# Patient Record
Sex: Female | Born: 1975 | Hispanic: Yes | Marital: Married | Smoking: Current some day smoker
Health system: Southern US, Community
[De-identification: ages and names within clinical notes are randomized; demographics above are authoritative.]

## PROBLEM LIST (undated history)

## (undated) HISTORY — PX: TUBAL LIGATION: SHX77

## (undated) HISTORY — PX: CHOLECYSTECTOMY: SHX55

---

## 2016-05-16 ENCOUNTER — Telehealth: Payer: Self-pay | Admitting: Gastroenterology

## 2016-05-16 ENCOUNTER — Encounter: Payer: Self-pay | Admitting: *Deleted

## 2016-05-16 ENCOUNTER — Emergency Department
Admission: EM | Admit: 2016-05-16 | Discharge: 2016-05-16 | Disposition: A | Discharge status: Other Acute Inpatient Hospital | Payer: Medicaid Other | Attending: Emergency Medicine | Admitting: Emergency Medicine

## 2016-05-16 ENCOUNTER — Emergency Department: Payer: Medicaid Other

## 2016-05-16 ENCOUNTER — Encounter: Payer: Self-pay | Admitting: Emergency Medicine

## 2016-05-16 ENCOUNTER — Telehealth: Payer: Self-pay | Admitting: Emergency Medicine

## 2016-05-16 ENCOUNTER — Emergency Department
Admission: EM | Admit: 2016-05-16 | Discharge: 2016-05-16 | Disposition: A | Discharge status: Home / Self Care | Payer: Medicaid Other | Source: Home / Self Care

## 2016-05-16 DIAGNOSIS — F17200 Nicotine dependence, unspecified, uncomplicated: Secondary | ICD-10-CM | POA: Insufficient documentation

## 2016-05-16 DIAGNOSIS — R52 Pain, unspecified: Secondary | ICD-10-CM

## 2016-05-16 DIAGNOSIS — K830 Cholangitis: Secondary | ICD-10-CM | POA: Diagnosis not present

## 2016-05-16 DIAGNOSIS — K8309 Other cholangitis: Secondary | ICD-10-CM

## 2016-05-16 DIAGNOSIS — Z5321 Procedure and treatment not carried out due to patient leaving prior to being seen by health care provider: Secondary | ICD-10-CM | POA: Insufficient documentation

## 2016-05-16 DIAGNOSIS — R509 Fever, unspecified: Secondary | ICD-10-CM

## 2016-05-16 DIAGNOSIS — R1011 Right upper quadrant pain: Secondary | ICD-10-CM | POA: Insufficient documentation

## 2016-05-16 LAB — URINALYSIS, COMPLETE (UACMP) WITH MICROSCOPIC
BILIRUBIN URINE: NEGATIVE
Bacteria, UA: NONE SEEN
Bilirubin Urine: NEGATIVE
GLUCOSE, UA: NEGATIVE mg/dL
GLUCOSE, UA: NEGATIVE mg/dL
HGB URINE DIPSTICK: NEGATIVE
Hgb urine dipstick: NEGATIVE
KETONES UR: NEGATIVE mg/dL
Ketones, ur: NEGATIVE mg/dL
LEUKOCYTES UA: NEGATIVE
LEUKOCYTES UA: NEGATIVE
NITRITE: NEGATIVE
Nitrite: NEGATIVE
PH: 7 (ref 5.0–8.0)
PROTEIN: NEGATIVE mg/dL
Protein, ur: 30 mg/dL — AB
SPECIFIC GRAVITY, URINE: 1.027 (ref 1.005–1.030)
Specific Gravity, Urine: 1.006 (ref 1.005–1.030)
pH: 8 (ref 5.0–8.0)

## 2016-05-16 LAB — COMPREHENSIVE METABOLIC PANEL
ALK PHOS: 134 U/L — AB (ref 38–126)
ALT: 293 U/L — AB (ref 14–54)
ALT: 467 U/L — ABNORMAL HIGH (ref 14–54)
ANION GAP: 7 (ref 5–15)
AST: 620 U/L — ABNORMAL HIGH (ref 15–41)
AST: 434 U/L — ABNORMAL HIGH (ref 15–41)
Albumin: 4 g/dL (ref 3.5–5.0)
Albumin: 4.2 g/dL (ref 3.5–5.0)
Alkaline Phosphatase: 119 U/L (ref 38–126)
Anion gap: 7 (ref 5–15)
BILIRUBIN TOTAL: 1.2 mg/dL (ref 0.3–1.2)
BILIRUBIN TOTAL: 2.9 mg/dL — AB (ref 0.3–1.2)
BUN: 13 mg/dL (ref 6–20)
BUN: 9 mg/dL (ref 6–20)
CALCIUM: 9 mg/dL (ref 8.9–10.3)
CHLORIDE: 105 mmol/L (ref 101–111)
CO2: 25 mmol/L (ref 22–32)
CO2: 24 mmol/L (ref 22–32)
CREATININE: 0.6 mg/dL (ref 0.44–1.00)
Calcium: 9 mg/dL (ref 8.9–10.3)
Chloride: 100 mmol/L — ABNORMAL LOW (ref 101–111)
Creatinine, Ser: 0.54 mg/dL (ref 0.44–1.00)
GFR calc non Af Amer: >60 mL/min (ref >60)
Glucose, Bld: 139 mg/dL — ABNORMAL HIGH (ref 65–99)
Glucose, Bld: 118 mg/dL — ABNORMAL HIGH (ref 65–99)
Potassium: 3.6 mmol/L (ref 3.5–5.1)
Potassium: 3.6 mmol/L (ref 3.5–5.1)
SODIUM: 131 mmol/L — AB (ref 135–145)
Sodium: 137 mmol/L (ref 135–145)
TOTAL PROTEIN: 7.4 g/dL (ref 6.5–8.1)
TOTAL PROTEIN: 8 g/dL (ref 6.5–8.1)

## 2016-05-16 LAB — CBC
HCT: 33.8 % — ABNORMAL LOW (ref 35.0–47.0)
HCT: 34.3 % — ABNORMAL LOW (ref 35.0–47.0)
HEMOGLOBIN: 11.4 g/dL — AB (ref 12.0–16.0)
Hemoglobin: 11.6 g/dL — ABNORMAL LOW (ref 12.0–16.0)
MCH: 27.6 pg (ref 26.0–34.0)
MCH: 26.9 pg (ref 26.0–34.0)
MCHC: 34.3 g/dL (ref 32.0–36.0)
MCHC: 33.3 g/dL (ref 32.0–36.0)
MCV: 80.5 fL (ref 80.0–100.0)
MCV: 80.8 fL (ref 80.0–100.0)
PLATELETS: 207 10*3/uL (ref 150–440)
Platelets: 227 10*3/uL (ref 150–440)
RBC: 4.2 MIL/uL (ref 3.80–5.20)
RBC: 4.24 MIL/uL (ref 3.80–5.20)
RDW: 14.3 % (ref 11.5–14.5)
RDW: 14.7 % — ABNORMAL HIGH (ref 11.5–14.5)
WBC: 14.6 10*3/uL — AB (ref 3.6–11.0)
WBC: 18.2 10*3/uL — AB (ref 3.6–11.0)

## 2016-05-16 LAB — LIPASE, BLOOD
LIPASE: 88 U/L — AB (ref 11–51)
Lipase: 19 U/L (ref 11–51)

## 2016-05-16 LAB — PREGNANCY, URINE: Preg Test, Ur: NEGATIVE

## 2016-05-16 MED ORDER — ACETAMINOPHEN 500 MG PO TABS
ORAL_TABLET | ORAL | Status: AC
  Filled 2016-05-16: qty 2

## 2016-05-16 MED ORDER — PIPERACILLIN-TAZOBACTAM 3.375 G IVPB 30 MIN
3.3750 g | Freq: Once | INTRAVENOUS | Status: AC
  Administered 2016-05-16: 3.375 g via INTRAVENOUS
  Filled 2016-05-16: qty 50

## 2016-05-16 MED ORDER — ACETAMINOPHEN 500 MG PO TABS
1000.0000 mg | ORAL_TABLET | Freq: Once | ORAL | Status: AC
  Administered 2016-05-16: 1000 mg via ORAL

## 2016-05-16 MED ORDER — IOPAMIDOL (ISOVUE-300) INJECTION 61%
100.0000 mL | Freq: Once | INTRAVENOUS | Status: AC | PRN
  Administered 2016-05-16: 100 mL via INTRAVENOUS

## 2016-05-16 MED ORDER — IOPAMIDOL (ISOVUE-300) INJECTION 61%
30.0000 mL | Freq: Once | INTRAVENOUS | Status: AC | PRN
  Administered 2016-05-16: 30 mL via ORAL

## 2016-05-16 NOTE — ED Notes (Signed)
Patient transported to US at this time.  Will continue to monitor.   

## 2016-05-16 NOTE — ED Notes (Signed)
Pt noted leaving ED lobby with family 

## 2016-05-16 NOTE — Telephone Encounter (Signed)
I was paged directly by the ER at [facility]. Spoke with ER MD about her.  I explained I am happy to see her if she is transferred to [facility] (through the usual route of calling [facility], admitted by hospitalist).  Alternatively, she could be admitted to [facility], given IV antibiotics and fluid and seen in the AM by GI there.

## 2016-05-16 NOTE — ED Notes (Signed)
Per dr Lenard Lance, u/s at this time only.  Pt had labs done last night in er.

## 2016-05-16 NOTE — ED Notes (Addendum)
Assessment performed with use of hospital spanish interpreter.  Patient complaining of centralized abdominal pain radiating to her left upper quadrant since yesterday.  Patient reports vomiting 10 times yesterday, none today.  Patient reports intermittent abdominal pain for the past 8 years post cholecystectomy.  Patient states pain is usually in her right upper quadrant.

## 2016-05-16 NOTE — ED Triage Notes (Signed)
Pt presents to ED with right upper quad abd pain that radiates around to her back. Pt states she had her gallbladder removed 9 years ago in [place] but states she has continued pain. Pt states she usually takes biomesina compuesta /250mg  and pain improves, but pt reports she has had no relief today.  Vomiting today X8. Denies fever.  Has been evaluated for the same and was told there may be a gallstone stuck in the bowel duct. Pain increases with palpation.

## 2016-05-16 NOTE — ED Triage Notes (Signed)
Pt reports right upper quad abd pain.  Pt was in the er last night but left without being seen.  Pt returns today with n/v and continues to have pain. Pt alert.  Interpreter with pt.

## 2016-05-16 NOTE — ED Provider Notes (Signed)
[facility] Emergency Department Provider Note  Time seen: 5:57 PM  I have reviewed the triage vital signs and the nursing notes.   HISTORY  Chief Complaint Abdominal Pain    HPI Victoria Cherry is a 41 y.o. female status post cholecystectomy who presents to the emergency department for right upper quadrant pain and vomiting. According to the patient since last night she has been expressing right upper quadrant pain and vomiting. Patient is a fever of 100.9 in the emergency department, unknown to the patient prior to arrival. Patient states she had her gallbladder removed 8 years ago in [place]. She states since that time she occasionally gets right upper quadrant pain but states this is the worse, and she has not had a fever in the past with the pain. Patient came to the emergency department last night for evaluation but left before being seen.Patient states 4/10 pain currently located in the right upper quadrant. Denies any cough or chest pain. States nausea and vomiting, denies diarrhea. Denies dysuria. Denies vaginal bleeding or discharge.  No past medical history on file.  There are no active problems to display for this patient.   Past Surgical History:  Procedure Laterality Date  . CESAREAN SECTION    . CHOLECYSTECTOMY    . TUBAL LIGATION      Prior to Admission medications   Not on File    No Known Allergies  No family history on file.  Social History Social History  Substance Use Topics  . Smoking status: Current Some Day Smoker  . Smokeless tobacco: Never Used  . Alcohol use No    Review of Systems Constitutional: Positive for fever in the emergency department. Eyes: Negative for visual changes. ENT: Negative for congestion Cardiovascular: Negative for chest pain. Respiratory: Negative for shortness of breath. Gastrointestinal: Right upper quadrant pain. Positive for nausea and vomiting. Negative for  diarrhea. Genitourinary: Negative for dysuria. Musculoskeletal: Negative for back pain Neurological: Negative for headache All other ROS negative  ____________________________________________   PHYSICAL EXAM:  VITAL SIGNS: ED Triage Vitals  Enc Vitals Group     BP 05/16/16 1637 (!) 109/53     Pulse Rate 05/16/16 1637 (!) 112     Resp 05/16/16 1637 20     Temp 05/16/16 1637 (!) 100.9 F (38.3 C)     Temp Source 05/16/16 1637 Oral     SpO2 05/16/16 1637 100 %     Weight 05/16/16 1638 193 lb (87.5 kg)     Height 05/16/16 1638  (1.651 m)     Head Circumference --      Peak Flow --      Pain Score 05/16/16 1637 10     Pain Loc --      Pain Edu? --      Excl. in GC? --     Constitutional: Alert and oriented. Well appearing and in no distress. Eyes: Normal exam ENT   Head: Normocephalic and atraumatic.   Nose: No congestion/rhinnorhea.   Mouth/Throat: Mucous membranes are moist. Cardiovascular: Normal rate, regular rhythm around 100 bpm. Respiratory: Normal respiratory effort without tachypnea nor retractions. Breath sounds are clear Gastrointestinal: Soft, moderate right upper quadrant tenderness palpation with mild diffuse tenderness. No rebound or guarding. No distention. Musculoskeletal: Nontender with normal range of motion in all extremities. Neurologic:  Normal speech and language. No gross focal neurologic deficits  Skin:  Skin is warm, dry and intact.  Psychiatric: Mood and affect are normal.  RADIOLOGY   CLINICAL DATA: Right upper quadrant abdominal pain  EXAM: US ABDOMEN LIMITED - RIGHT UPPER QUADRANT  COMPARISON: None.  FINDINGS: Gallbladder:  Surgically absent  Common bile duct:  Diameter: 10 mm  Liver:  No focal lesion identified. Within normal limits in parenchymal echogenicity.  IMPRESSION: 1. Status post cholecystectomy. Prominent common bile duct, likely related to post cholecystectomy status. Suggest correlation  with laboratory values. 2. Otherwise negative right upper quadrant ultrasound      ____________________________________________   INITIAL IMPRESSION / ASSESSMENT AND PLAN / ED COURSE  Pertinent labs & imaging results that were available during my care of the patient were reviewed by me and considered in my medical decision making (see chart for details).  Patient presents to the emergency department right upper quadrant pain since last night, vomiting and fever. Patient's labs yesterday showed a leukocytosis with elevated LFTs as well as a mildly elevated lipase. Patient's ultrasound today shows a prominent biliary duct otherwise negative. Given the patient's continued pain and fever we will obtain a CT scan to further evaluate. If the CT is otherwise negative we will discuss with GI medicine given the prominent biliary duct with elevated LFTs and lipase.  Patient's repeat labs today show an elevating white blood cell count along with continued ability elevation and an increasing bilirubin currently 2.9. Given the patient's fever right upper quadrant pain and dilated biliary duct on ultrasound along with a CT scan showing no other focal abnormality besides possible enteritis I am quite concerned for the possibility of cholangitis in this patient. We will start the patient IV Zosyn. Unfortunately our hospitalist does not have GI coverage tonight. Given the patient's fever with increasing white count and increasing bilirubin we will discuss with[facility] for possible transfer.  Discussed with [facility]. He states he is not on call for our hospital. He believes the patient can be admitted locally to [facility] and be seen by our GI covering doctor tomorrow. However given the patient's fever and increasing bilirubin and white count I believe the patient needs to go to a facility that has the ability to perform an ERCP urgently if needed.  I will discuss with [facility] medicine for possible  transfer.  [facility] has accepted the patient pending ER to ER transfer. I discussed the patient with Dr. Noralee Stain of [facility] ER who is accepted the patient ER to ER. Patient's vitals remained stable with a systolic blood pressure around 100 and a heart rate in the 70s.  ____________________________________________   FINAL CLINICAL IMPRESSION(S) / ED DIAGNOSES  Right upper quadrant pain Fever Vomiting Cholangitis   Minna Antis, MD 05/16/16 2021

## 2016-05-16 NOTE — Telephone Encounter (Signed)
Called patient due to lwot to inquire about condition and follow up plans. Spoke to patient via Victoria Cherry interpretter.  Patient continues to have slight pain in abdomen. I explained that I would recommend that she be evaluated by a physician and explained abnormal lab tests including ast alt and wbc.  She does not have a doctor to go to. I told her she could return to the emergency dept to have exam.  She says she may come back later today.

## 2016-05-18 LAB — URINE CULTURE

## 2017-12-15 IMAGING — US US ABDOMEN LIMITED
1 series · 14 of 25 positions shown · non-contrast
Comparison: None.

CLINICAL DATA: Right upper quadrant abdominal pain

EXAM:
US ABDOMEN LIMITED - RIGHT UPPER QUADRANT

[Series 1: us abdomen limited · 0.20mm/px · 14 of 28 slices shown]
[im 1/28]
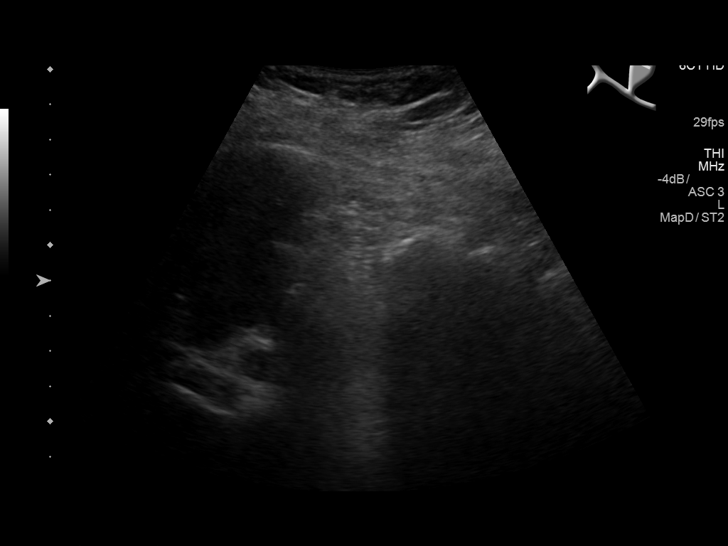
[im 3/28]
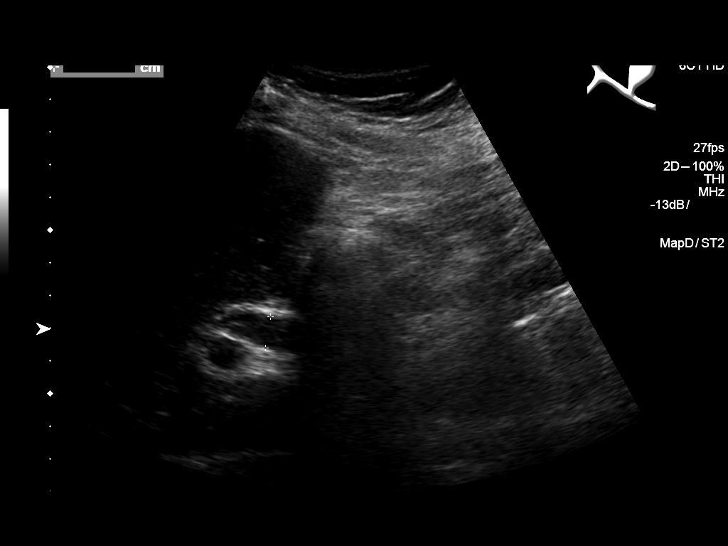
[im 5/28]
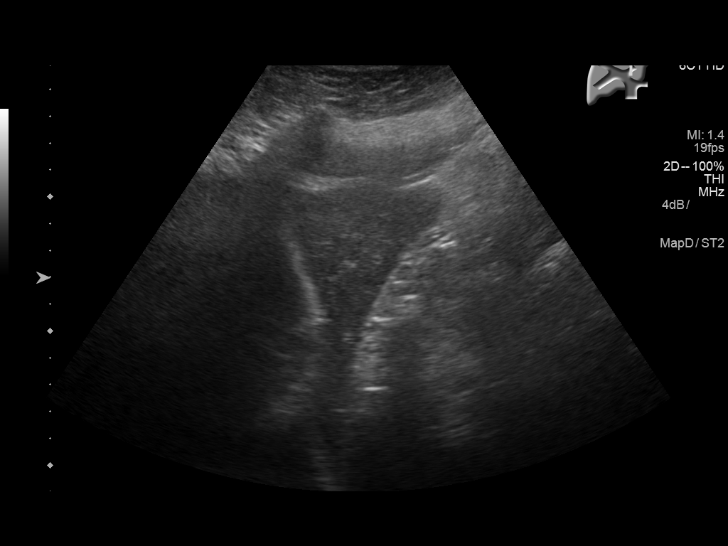
[im 7/28]
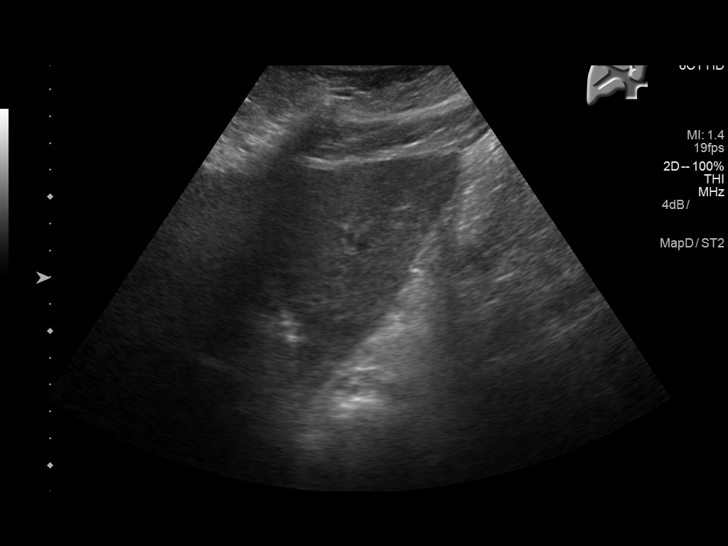
[im 10/28]
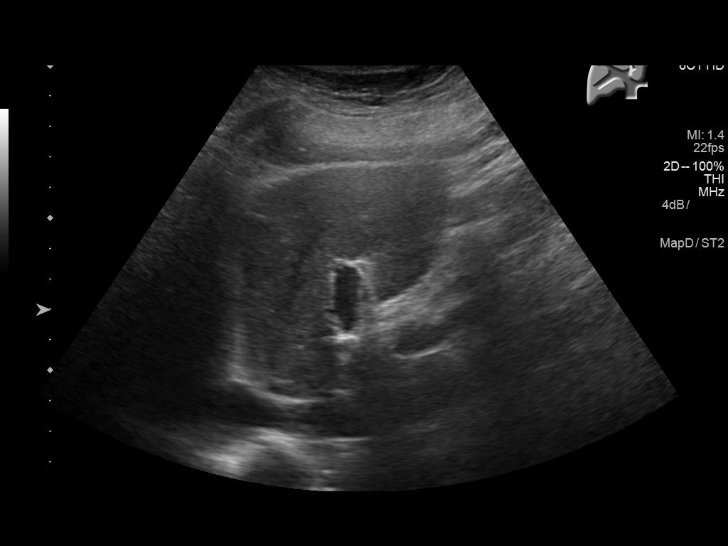
[im 11/28]
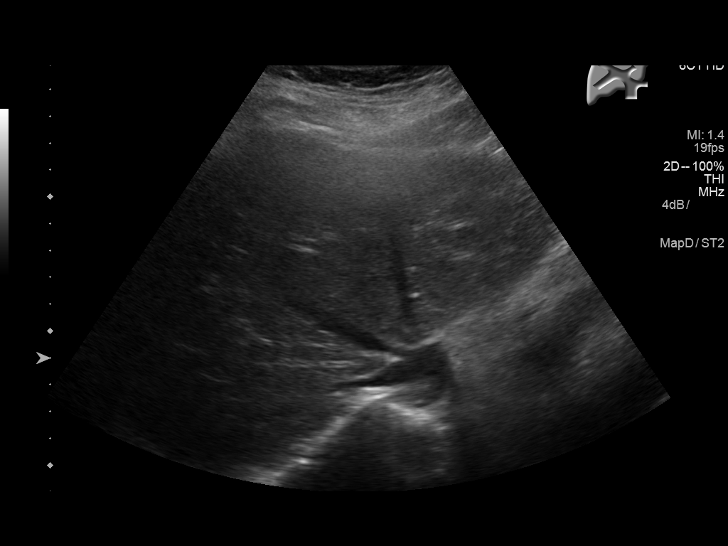
[im 13/28]
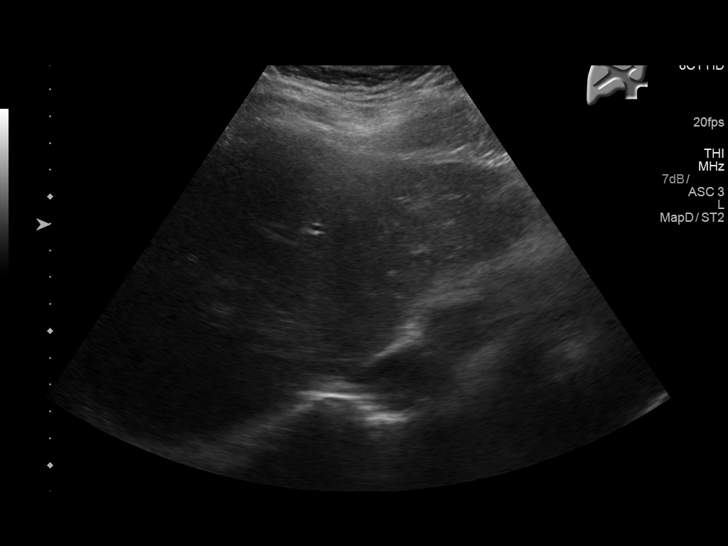
[im 15/28]
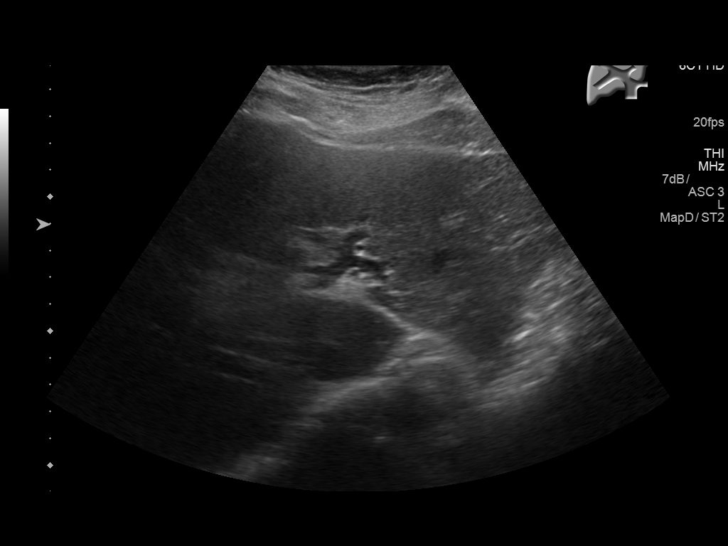
[im 17/28]
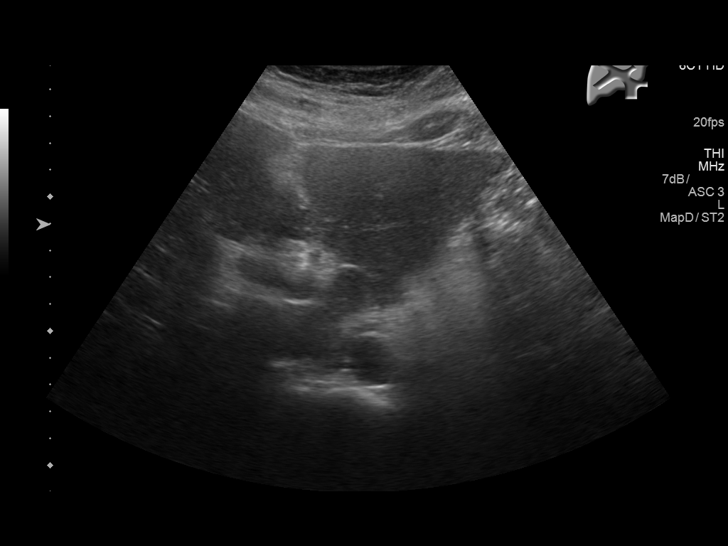
[im 19/28]
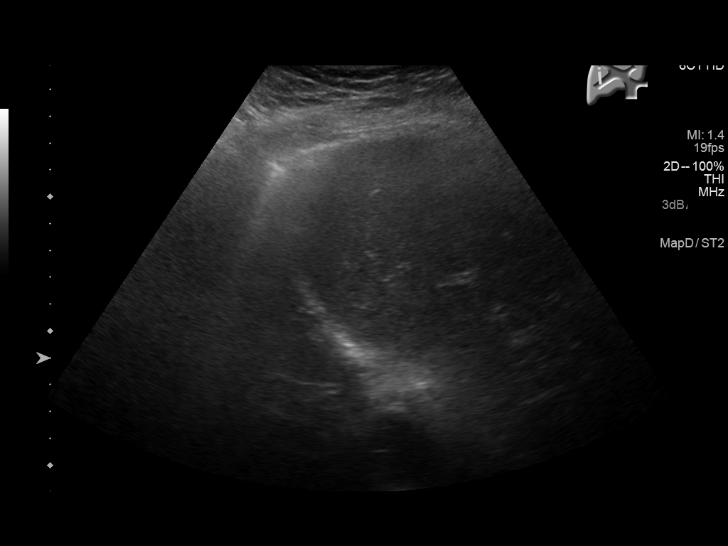
[im 21/28]
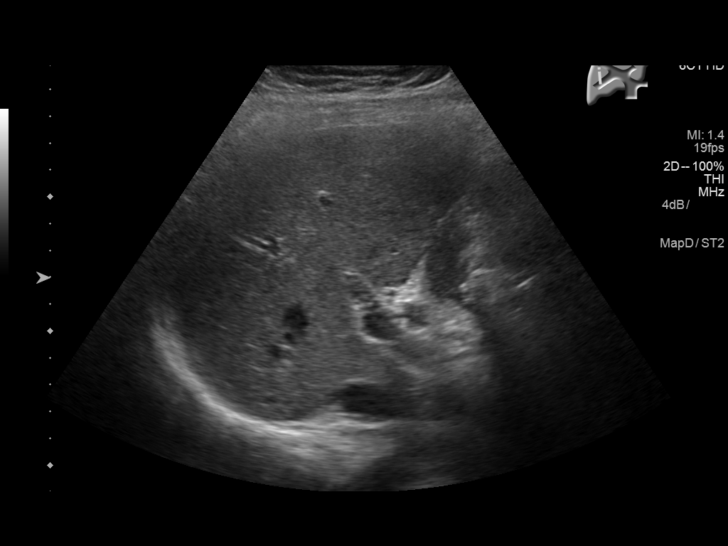
[im 23/28]
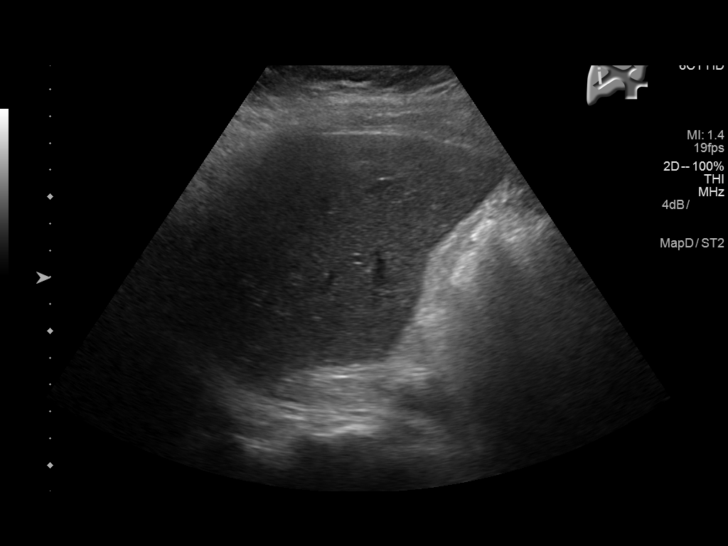
[im 25/28]
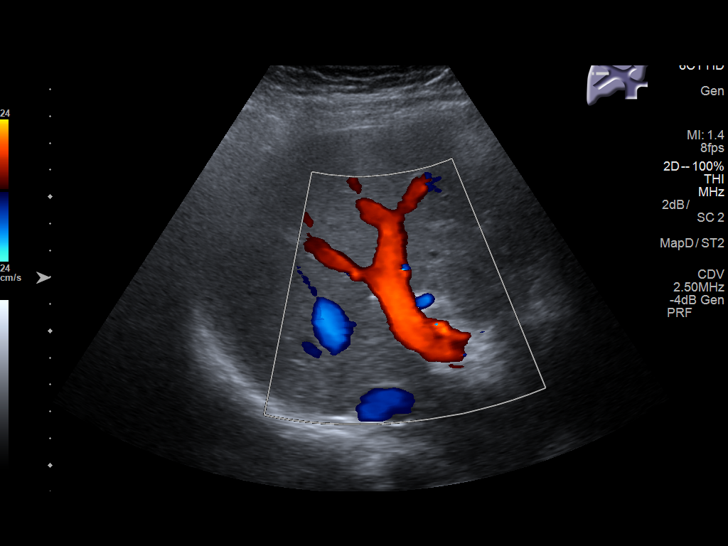
[im 28/28]
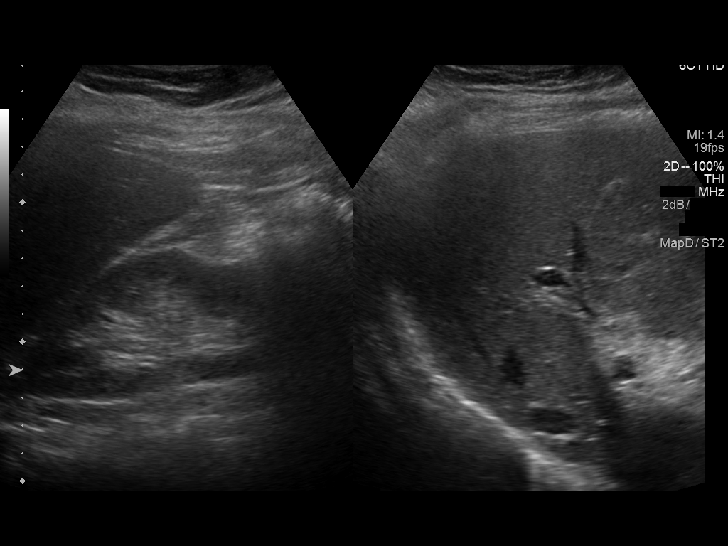

[14 of 25 positions shown; findings below may reference images not displayed]

FINDINGS: Gallbladder:

Surgically absent

Common bile duct:

Diameter: 10 mm

Liver:

No focal lesion identified. Within normal limits in parenchymal
echogenicity.
IMPRESSION: 1. Status post cholecystectomy. Prominent common bile duct, likely
related to post cholecystectomy status. Suggest correlation with
laboratory values.
2. Otherwise negative right upper quadrant ultrasound

## 2018-04-24 IMAGING — CT CT ABD-PELV W/ CM
2 of 4 series · 16 of 46 positions shown, 18 images · IV contrast (APPLIED)
Comparison: 05/16/2016 ultrasound

CLINICAL DATA: 40-year-old female with abdominal and pelvic pain
and vomiting for 1 day.

EXAM:
CT ABDOMEN AND PELVIS WITH CONTRAST
TECHNIQUE: Multidetector CT imaging of the abdomen and pelvis was performed
using the standard protocol following bolus administration of
intravenous contrast.
CONTRAST:  100mL TGAY66-500 IOPAMIDOL (TGAY66-500) INJECTION 61%

[Series 2: axial st · axial · 0.77mm/px · z∈[-602,-177]mm · 13 of 93 slices shown, 15 images]
[im 4/93  soft-tissue]
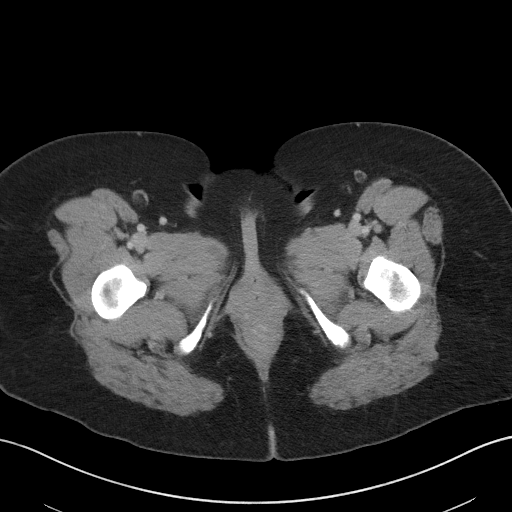
[im 4/93  bone]
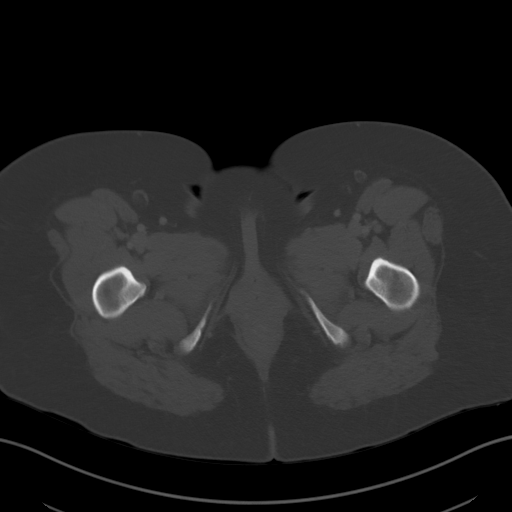
[im 12/93  soft-tissue]
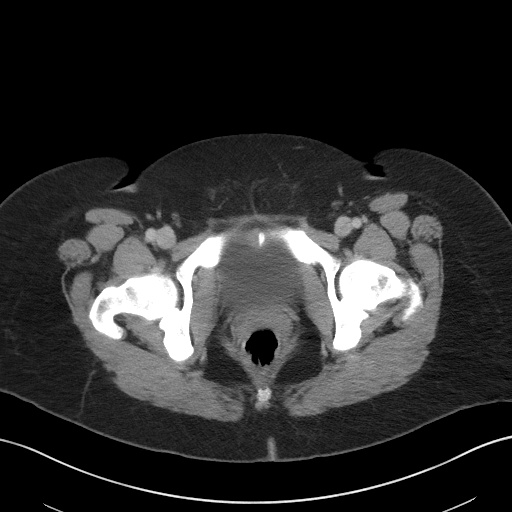
[im 20/93  soft-tissue]
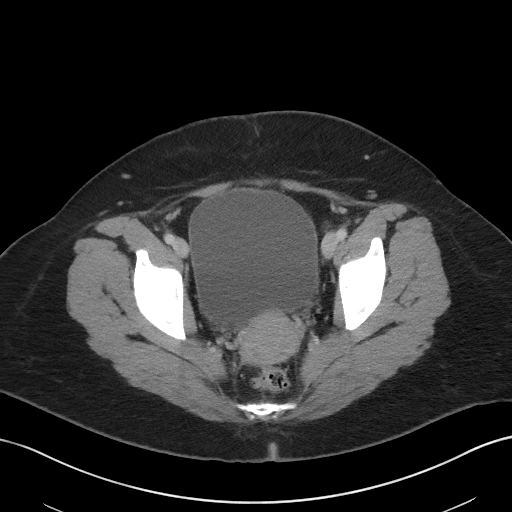
[im 27/93  soft-tissue]
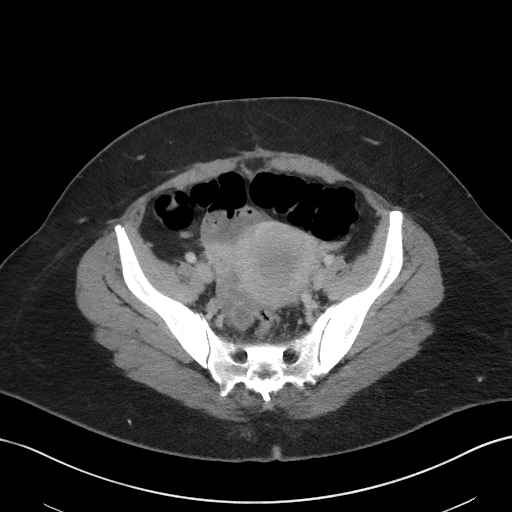
[im 31/93  soft-tissue]
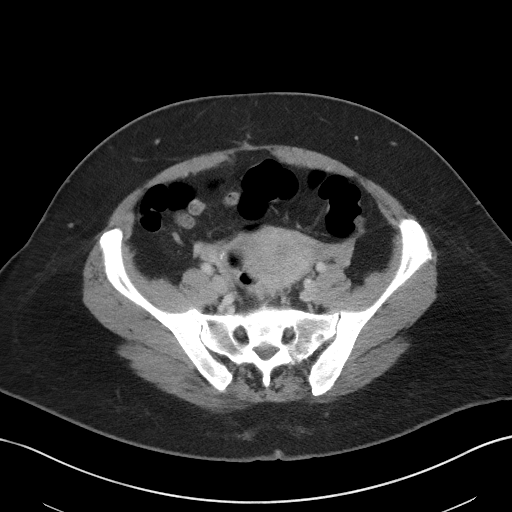
[im 39/93  soft-tissue]
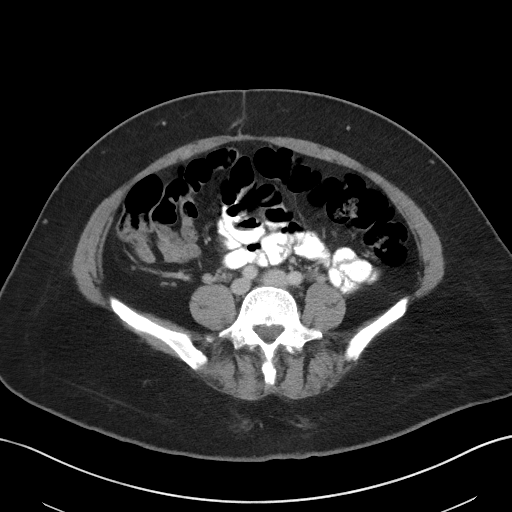
[im 47/93  soft-tissue]
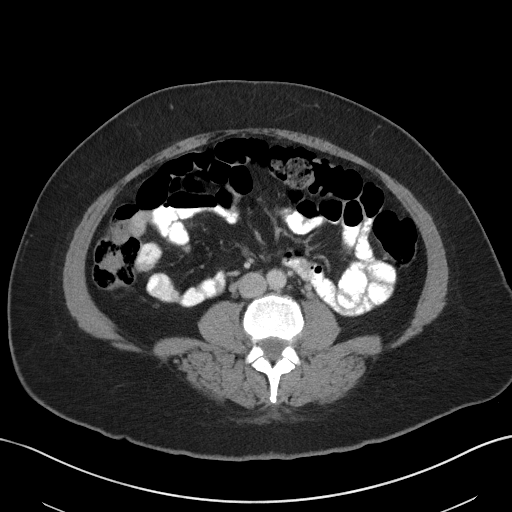
[im 54/93  soft-tissue]
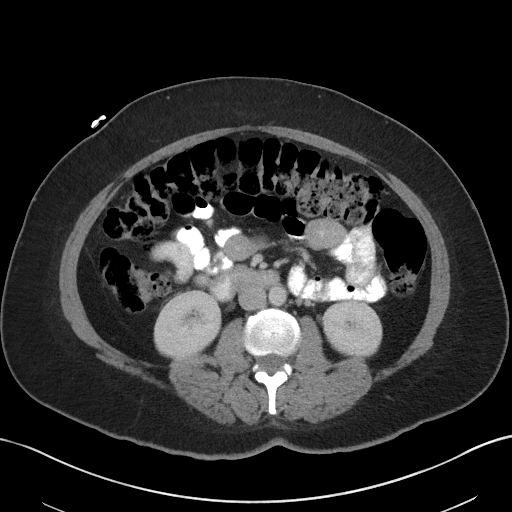
[im 62/93  soft-tissue]
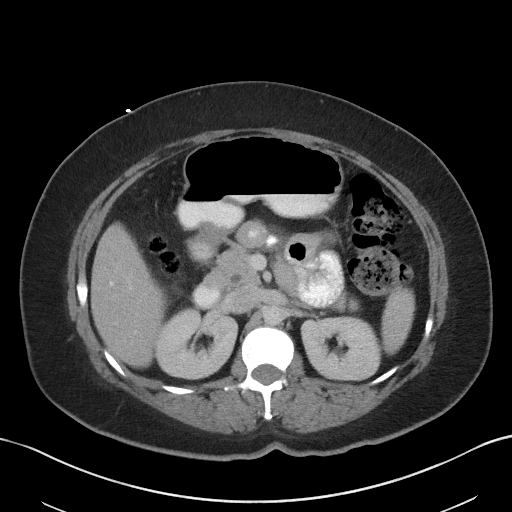
[im 62/93  bone]
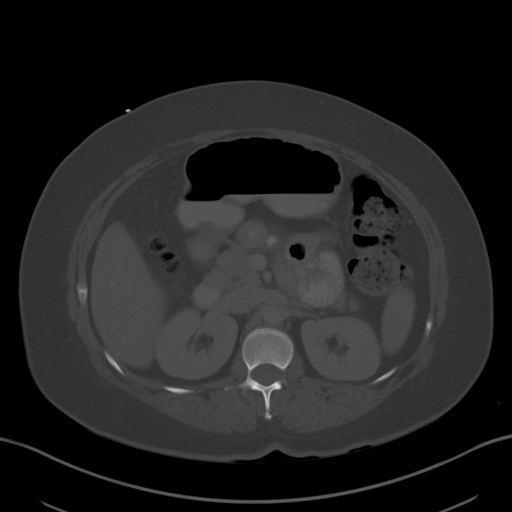
[im 66/93  soft-tissue]
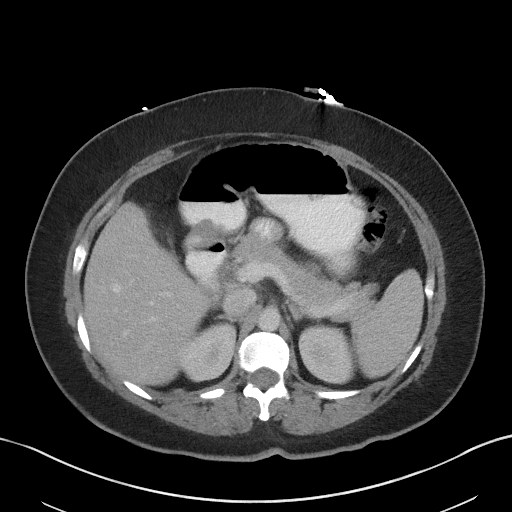
[im 73/93  soft-tissue]
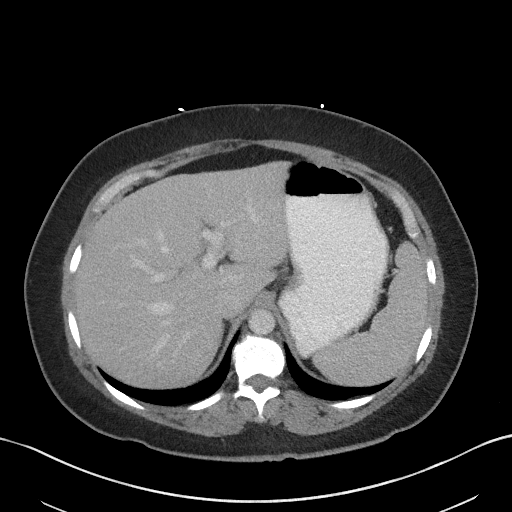
[im 81/93  soft-tissue]
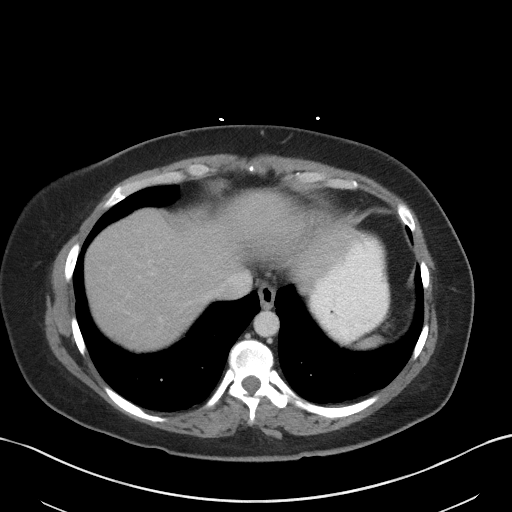
[im 89/93  soft-tissue]
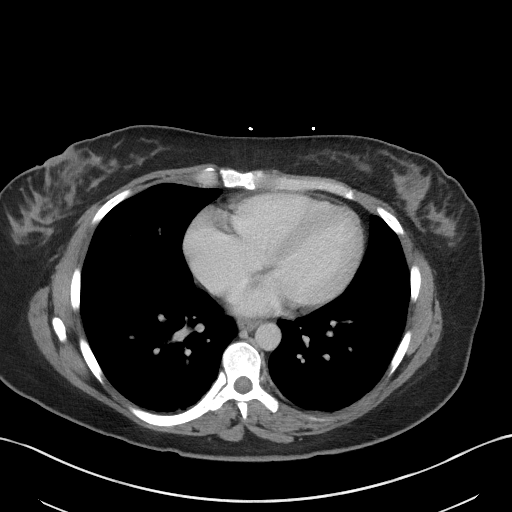

[Series 5: coronal st · coronal · 0.78mm/px · 3 of 90 slices shown]
[im 30/90  soft-tissue]
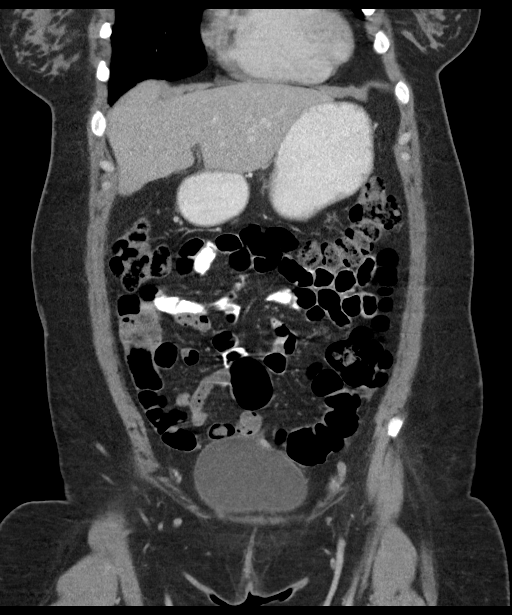
[im 40/90  soft-tissue]
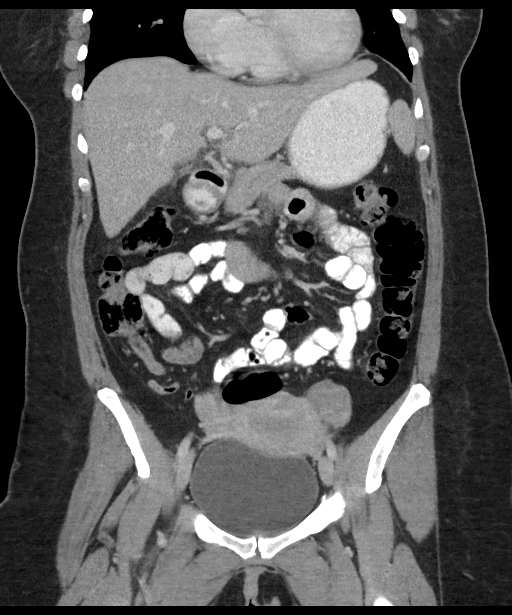
[im 50/90  soft-tissue]
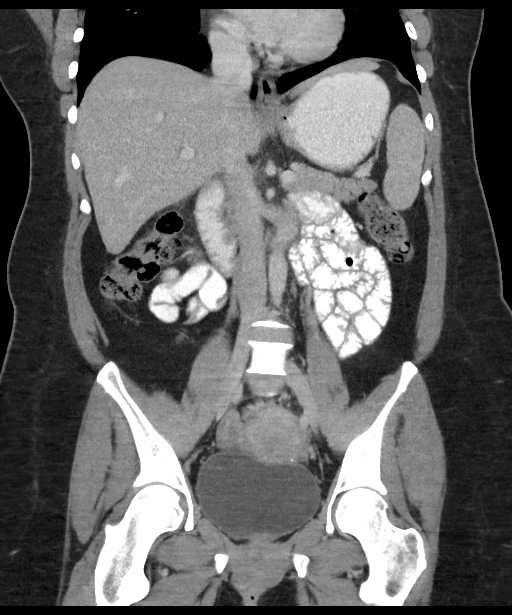

[16 of 46 positions shown; findings below may reference images not displayed]

FINDINGS: Lower chest: No acute abnormality.

Hepatobiliary: The liver is unremarkable. The patient is status post
cholecystectomy. There is no evidence of biliary dilatation.

Pancreas: Unremarkable

Spleen: Unremarkable

Adrenals/Urinary Tract: The kidneys, adrenal glands and bladder are
unremarkable.

Stomach/Bowel: Circumferential wall thickening of several small
bowel loops in the mid abdomen are noted -question enteritis. There
is no evidence of bowel obstruction or pneumoperitoneum. The
appendix is normal.

Vascular/Lymphatic: No significant vascular findings are present. No
enlarged abdominal or pelvic lymph nodes.

Reproductive: Uterus and bilateral adnexa are unremarkable.

Other: No free fluid or abscess.

Musculoskeletal: No acute or significant osseous findings.
IMPRESSION: Mild circumferential wall thickening of several small bowel loops
within the mid abdomen-question enteritis. No evidence of bowel
obstruction or pneumoperitoneum.
